# Patient Record
Sex: Female | Born: 2000 | Race: White | Hispanic: No | Marital: Single | Smoking: Never smoker
Health system: Southern US, Community
[De-identification: ages and names within clinical notes are randomized; demographics above are authoritative.]

---

## 2020-03-23 ENCOUNTER — Emergency Department (HOSPITAL_BASED_OUTPATIENT_CLINIC_OR_DEPARTMENT_OTHER): Payer: 59

## 2020-03-23 ENCOUNTER — Emergency Department (HOSPITAL_BASED_OUTPATIENT_CLINIC_OR_DEPARTMENT_OTHER)
Admission: EM | Admit: 2020-03-23 | Discharge: 2020-03-23 | Disposition: A | Discharge status: Home / Self Care | Payer: 59 | Attending: Emergency Medicine | Admitting: Emergency Medicine

## 2020-03-23 ENCOUNTER — Other Ambulatory Visit: Payer: Self-pay

## 2020-03-23 ENCOUNTER — Encounter (HOSPITAL_BASED_OUTPATIENT_CLINIC_OR_DEPARTMENT_OTHER): Payer: Self-pay | Admitting: Emergency Medicine

## 2020-03-23 DIAGNOSIS — R059 Cough, unspecified: Secondary | ICD-10-CM

## 2020-03-23 DIAGNOSIS — Z20822 Contact with and (suspected) exposure to covid-19: Secondary | ICD-10-CM | POA: Insufficient documentation

## 2020-03-23 LAB — RESPIRATORY PANEL BY RT PCR (FLU A&B, COVID)
Influenza A by PCR: NEGATIVE
Influenza B by PCR: NEGATIVE
SARS Coronavirus 2 by RT PCR: NEGATIVE

## 2020-03-23 NOTE — ED Provider Notes (Signed)
[facility]PT [facility] Inpatient [facility] Emergency Department Provider Note MRN:  [id number]  Arrival date & time: 03/23/20     Chief Complaint   Cough   History of Present Illness   Alicia Duran is a 19 y.o. year-old female with no pertinent past medical history presenting to the ED with chief complaint of cough cough.  3 days of persistent productive cough, thick sputum.  Coughing fits.  Occasionally with some streaks of blood but this seems to be improving, no blood today.  Denies any chest pain or shortness of breath, no leg pain or swelling, no headache or vision change, no abdominal pain, no nasal congestion or sore throat or body aches or loss of taste or smell.  Patient's suite mate recently ill with pneumonia.  Review of Systems  A complete 10 system review of systems was obtained and all systems are negative except as noted in the HPI and PMH.   Patient's Health History   History reviewed. No pertinent past medical history.  History reviewed. No pertinent surgical history.  No family history on file.  Social History   Socioeconomic History  . Marital status: Single    Spouse name: Not on file  . Number of children: Not on file  . Years of education: Not on file  . Highest education level: Not on file  Occupational History  . Not on file  Tobacco Use  . Smoking status: Never Smoker  . Smokeless tobacco: Never Used  Substance and Sexual Activity  . Alcohol use: Never  . Drug use: Never  . Sexual activity: Not on file  Other Topics Concern  . Not on file  Social History Narrative  . Not on file   Social Determinants of Health   Financial Resource Strain:   . Difficulty of Paying Living Expenses: Not on file  Food Insecurity:   . Worried About Programme researcher, broadcasting/film/video in the Last Year: Not on file  . Ran Out of Food in the Last Year: Not on file  Transportation Needs:   . Lack of Transportation (Medical): Not on file  . Lack of Transportation (Non-Medical): Not  on file  Physical Activity:   . Days of Exercise per Week: Not on file  . Minutes of Exercise per Session: Not on file  Stress:   . Feeling of Stress : Not on file  Social Connections:   . Frequency of Communication with Friends and Family: Not on file  . Frequency of Social Gatherings with Friends and Family: Not on file  . Attends Religious Services: Not on file  . Active Member of Clubs or Organizations: Not on file  . Attends Banker Meetings: Not on file  . Marital Status: Not on file  Intimate Partner Violence:   . Fear of Current or Ex-Partner: Not on file  . Emotionally Abused: Not on file  . Physically Abused: Not on file  . Sexually Abused: Not on file     Physical Exam   Vitals:   03/23/20 1913  BP: 116/89  Pulse: (!) 105  Resp: 18  Temp: 97.9 F (36.6 C)  SpO2: 100%    CONSTITUTIONAL: Well-appearing, NAD NEURO:  Alert and oriented x 3, no focal deficits EYES:  eyes equal and reactive ENT/NECK:  no LAD, no JVD CARDIO: Regular rate, well-perfused, normal S1 and S2 PULM:  CTAB no wheezing or rhonchi GI/GU:  normal bowel sounds, non-distended, non-tender MSK/SPINE:  No gross deformities, no edema SKIN:  no rash,  atraumatic PSYCH:  Appropriate speech and behavior  *Additional and/or pertinent findings included in MDM below  Diagnostic and Interventional Summary    EKG Interpretation  Date/Time:    Ventricular Rate:    PR Interval:    QRS Duration:   QT Interval:    QTC Calculation:   R Axis:     Text Interpretation:        Labs Reviewed  RESPIRATORY PANEL BY RT PCR (FLU A&B, COVID)    DG Chest Port 1 View  Final Result      Medications - No data to display   Procedures  /  Critical Care Procedures  ED Course and Medical Decision Making  I have reviewed the triage vital signs, the nursing notes, and pertinent available records from the EMR.  Listed above are laboratory and imaging tests that I personally ordered, reviewed,  and interpreted and then considered in my medical decision making (see below for details).  Suspect bronchitis with small amount of hemoptysis, also considering pneumonia.  Highly doubt pulmonary embolism given lack of DVT signs or symptoms, no chest pain, shortness of breath, normal vital signs.     X-ray without focal pneumonia, appropriate for discharge.  Elmer Sow. Pilar Plate, MD [facility] Emergency Medicine [facility] [email or web address]  Final Clinical Impressions(s) / ED Diagnoses     ICD-10-CM   1. Cough  R05.9 DG Chest Port 1 View    DG Chest Port 1 View    CANCELED: DG Chest 2 View    CANCELED: DG Chest 2 View    ED Discharge Orders    None       Discharge Instructions Discussed with and Provided to Patient:     Discharge Instructions     You were evaluated in the Emergency Department and after careful evaluation, we did not find any emergent condition requiring admission or further testing in the hospital.  Your exam/testing today is overall reassuring.  Symptoms seem to be due to a viral bronchitis.  X-ray without any evidence of pneumonia.  Please follow-up on your Covid test.  If positive, you will need to isolate at home away from others for 10 days.  Please return to the Emergency Department if you experience any worsening of your condition.   Thank you for allowing [place] to be a part of your care.       Sabas Sous, MD 03/23/20 2041

## 2020-03-23 NOTE — Discharge Instructions (Addendum)
You were evaluated in the Emergency Department and after careful evaluation, we did not find any emergent condition requiring admission or further testing in the hospital.  Your exam/testing today is overall reassuring.  Symptoms seem to be due to a viral bronchitis.  X-ray without any evidence of pneumonia.  Please follow-up on your Covid test.  If positive, you will need to isolate at home away from others for 10 days.  Please return to the Emergency Department if you experience any worsening of your condition.   Thank you for allowing [place] to be a part of your care.

## 2020-03-23 NOTE — ED Triage Notes (Signed)
Reports a heavy cough since Thursday night with thick sputum at time with small amount of blood reported.  Denies having any other symptoms.

## 2021-04-30 IMAGING — DX DG CHEST 1V PORT
1 series · 1 of 1 positions shown · non-contrast
Comparison: None.

CLINICAL DATA: Cough

EXAM:
PORTABLE CHEST 1 VIEW

[chest ap]
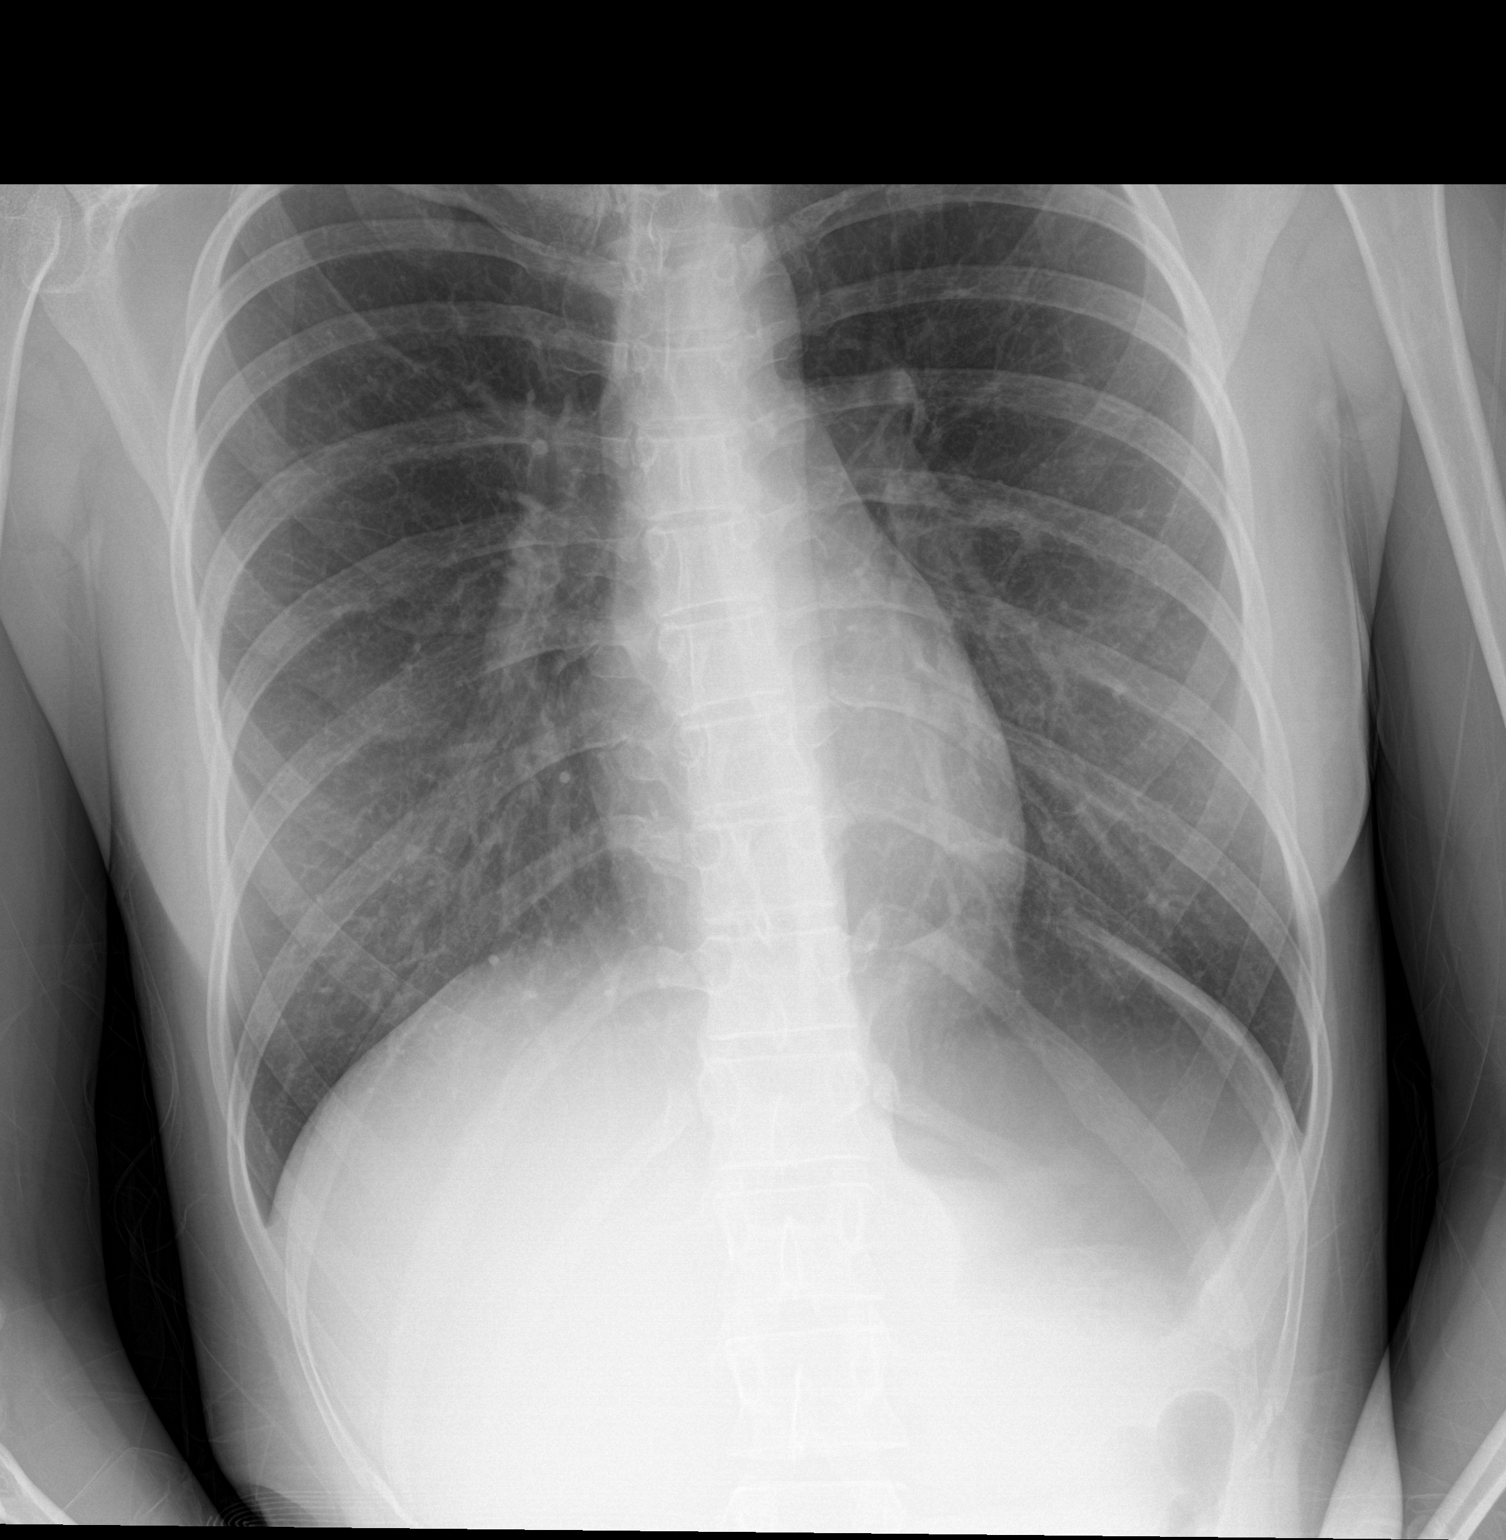

[1 of 1 positions shown; findings below may reference images not displayed]

FINDINGS: The heart size and mediastinal contours are within normal limits.
Both lungs are clear. The visualized skeletal structures are
unremarkable.
IMPRESSION: No active disease.
